# Patient Record
Sex: Female | Born: 1935
Health system: Southern US, Community
[De-identification: ages and names within clinical notes are randomized; demographics above are authoritative.]

## PROBLEM LIST (undated history)

## (undated) DIAGNOSIS — I10 Essential (primary) hypertension: Secondary | ICD-10-CM

## (undated) DIAGNOSIS — E119 Type 2 diabetes mellitus without complications: Secondary | ICD-10-CM

## (undated) DIAGNOSIS — C801 Malignant (primary) neoplasm, unspecified: Secondary | ICD-10-CM

## (undated) HISTORY — PX: BREAST SURGERY: SHX581

---

## 2017-06-11 ENCOUNTER — Emergency Department (HOSPITAL_BASED_OUTPATIENT_CLINIC_OR_DEPARTMENT_OTHER)
Admission: EM | Admit: 2017-06-11 | Discharge: 2017-06-11 | Disposition: A | Discharge status: Home / Self Care | Payer: Medicare Other | Attending: Emergency Medicine | Admitting: Emergency Medicine

## 2017-06-11 ENCOUNTER — Emergency Department (HOSPITAL_BASED_OUTPATIENT_CLINIC_OR_DEPARTMENT_OTHER): Payer: Medicare Other

## 2017-06-11 ENCOUNTER — Encounter (HOSPITAL_BASED_OUTPATIENT_CLINIC_OR_DEPARTMENT_OTHER): Payer: Self-pay

## 2017-06-11 DIAGNOSIS — Z79899 Other long term (current) drug therapy: Secondary | ICD-10-CM | POA: Insufficient documentation

## 2017-06-11 DIAGNOSIS — I509 Heart failure, unspecified: Secondary | ICD-10-CM | POA: Diagnosis not present

## 2017-06-11 DIAGNOSIS — M546 Pain in thoracic spine: Secondary | ICD-10-CM | POA: Diagnosis not present

## 2017-06-11 DIAGNOSIS — I110 Hypertensive heart disease with heart failure: Secondary | ICD-10-CM | POA: Diagnosis not present

## 2017-06-11 DIAGNOSIS — E119 Type 2 diabetes mellitus without complications: Secondary | ICD-10-CM | POA: Insufficient documentation

## 2017-06-11 DIAGNOSIS — M545 Low back pain: Secondary | ICD-10-CM | POA: Diagnosis present

## 2017-06-11 DIAGNOSIS — Z87891 Personal history of nicotine dependence: Secondary | ICD-10-CM | POA: Diagnosis not present

## 2017-06-11 DIAGNOSIS — M549 Dorsalgia, unspecified: Secondary | ICD-10-CM

## 2017-06-11 DIAGNOSIS — Z7984 Long term (current) use of oral hypoglycemic drugs: Secondary | ICD-10-CM | POA: Diagnosis not present

## 2017-06-11 HISTORY — DX: Type 2 diabetes mellitus without complications: E11.9

## 2017-06-11 HISTORY — DX: Malignant (primary) neoplasm, unspecified: C80.1

## 2017-06-11 HISTORY — DX: Essential (primary) hypertension: I10

## 2017-06-11 LAB — COMPREHENSIVE METABOLIC PANEL
ALBUMIN: 4.4 g/dL (ref 3.5–5.0)
ALT: 13 U/L — ABNORMAL LOW (ref 14–54)
ANION GAP: 12 (ref 5–15)
AST: 23 U/L (ref 15–41)
Alkaline Phosphatase: 80 U/L (ref 38–126)
BILIRUBIN TOTAL: 1 mg/dL (ref 0.3–1.2)
BUN: 14 mg/dL (ref 6–20)
CO2: 24 mmol/L (ref 22–32)
Calcium: 10.1 mg/dL (ref 8.9–10.3)
Chloride: 102 mmol/L (ref 101–111)
Creatinine, Ser: 0.6 mg/dL (ref 0.44–1.00)
GFR calc non Af Amer: >60 mL/min (ref >60)
Glucose, Bld: 143 mg/dL — ABNORMAL HIGH (ref 65–99)
POTASSIUM: 3.6 mmol/L (ref 3.5–5.1)
Sodium: 138 mmol/L (ref 135–145)
TOTAL PROTEIN: 7.2 g/dL (ref 6.5–8.1)

## 2017-06-11 LAB — CBC WITH DIFFERENTIAL/PLATELET
BASOS ABS: 0 10*3/uL (ref 0.0–0.1)
Basophils Relative: 0 %
EOS ABS: 0.1 10*3/uL (ref 0.0–0.7)
Eosinophils Relative: 2 %
HCT: 42 % (ref 36.0–46.0)
Hemoglobin: 14.6 g/dL (ref 12.0–15.0)
LYMPHS ABS: 1.5 10*3/uL (ref 0.7–4.0)
LYMPHS PCT: 22 %
MCH: 30.5 pg (ref 26.0–34.0)
MCHC: 34.8 g/dL (ref 30.0–36.0)
MCV: 87.9 fL (ref 78.0–100.0)
MONO ABS: 0.1 10*3/uL (ref 0.1–1.0)
Metamyelocytes Relative: 2 %
Monocytes Relative: 1 %
NEUTROS PCT: 73 %
Neutro Abs: 5.3 10*3/uL (ref 1.7–7.7)
PLATELETS: 205 10*3/uL (ref 150–400)
RBC: 4.78 MIL/uL (ref 3.87–5.11)
RDW: 12.8 % (ref 11.5–15.5)
WBC: 7 10*3/uL (ref 4.0–10.5)

## 2017-06-11 LAB — URINALYSIS, ROUTINE W REFLEX MICROSCOPIC
BILIRUBIN URINE: NEGATIVE
Glucose, UA: NEGATIVE mg/dL
HGB URINE DIPSTICK: NEGATIVE
KETONES UR: NEGATIVE mg/dL
NITRITE: NEGATIVE
PH: 6.5 (ref 5.0–8.0)
Protein, ur: 30 mg/dL — AB
Specific Gravity, Urine: 1.007 (ref 1.005–1.030)

## 2017-06-11 LAB — URINALYSIS, MICROSCOPIC (REFLEX): RBC / HPF: NONE SEEN RBC/hpf (ref 0–5)

## 2017-06-11 LAB — LIPASE, BLOOD: LIPASE: 29 U/L (ref 11–51)

## 2017-06-11 MED ORDER — ONDANSETRON HCL 4 MG/2ML IJ SOLN: 4.0000 mg | Freq: Once | INTRAMUSCULAR | Status: DC

## 2017-06-11 MED ORDER — MELOXICAM 7.5 MG PO TABS: 7.5000 mg | ORAL_TABLET | Freq: Every day | ORAL | 0 refills | Status: AC

## 2017-06-11 MED ORDER — HYDROCODONE-ACETAMINOPHEN 5-325 MG PO TABS
1.0000 | ORAL_TABLET | Freq: Once | ORAL | Status: AC
  Administered 2017-06-11: 1 via ORAL
  Filled 2017-06-11: qty 1

## 2017-06-11 MED ORDER — HYDROCODONE-ACETAMINOPHEN 5-325 MG PO TABS: 1.0000 | ORAL_TABLET | Freq: Every evening | ORAL | 0 refills | Status: AC | PRN

## 2017-06-11 MED ORDER — ONDANSETRON 4 MG PO TBDP
4.0000 mg | ORAL_TABLET | Freq: Once | ORAL | Status: AC
  Administered 2017-06-11: 4 mg via ORAL
  Filled 2017-06-11: qty 1

## 2017-06-11 MED FILL — MELOXICAM 7.5 MG TABLET: 7.5 | 7 days supply | Qty: 7 | Fill #0

## 2017-06-11 MED FILL — HYDROCODON-APAP 5-325: 5-325 | 10 days supply | Qty: 10 | Fill #0

## 2017-06-11 NOTE — ED Notes (Signed)
Patient transported to X-ray 

## 2017-06-11 NOTE — ED Provider Notes (Signed)
Patient seen and evaluated. Discussed with [place] Russo. Normal parasitic back. Pain not reproducible to palpation or breathing. No vesicles. Normal x-ray, lab, urine, and exam. Does have degenerative change of her spine. Symptoms started a few days after a chiropractic manipulation. However, not specifically of this area. Plan is low-dose short course 7.5 mg daily. Vicodin daily at bedtime. Cautioned of the sedative effects. Primary care follow-up.   Tanna Furry, MD 06/11/17 1141

## 2017-06-11 NOTE — ED Triage Notes (Signed)
Pt has complaints of thoracic back pain, went to PCP on Monday and Tuesday with no resolve, negative x-ray and tramadol given. Reports falling about 3 weeks ago which may not be related.

## 2017-06-11 NOTE — ED Notes (Signed)
Pt has complaints of nausea, PA notified.

## 2017-06-11 NOTE — Discharge Instructions (Signed)
Vicodin as needed at night for pain.  Mobic, an anti-inflammatory, once per day with food to avoid stomach upset. Follow-up with your physician.

## 2017-06-11 NOTE — ED Provider Notes (Signed)
[facility] DEPT [facility] Provider Note   CSN: [id number] Arrival date & time: 06/11/17  4235     History   Chief Complaint Chief Complaint  Patient presents with  . Back Pain    Right side     HPI Melissa Horn is a 83 y.o. female w PMHx of HTN, HLD, T2DM, CHF, presenting with persistent right-sided aching thoracic back pain that began gradually Sunday evening and has been worsening since. Pt was seen by UC on Monday where they did a CXR which showed a new lung nodule in the right apex. She saw her PCP on Tuesday, who did blood work and U/A, with normal workup. Pt states she was prescribed tramadol, without significant relief of symptoms. She states she tried to see her PCP this morning, however they were unable to get her in until 1345 and she says she couldn't wait that long 2/t pain. She localizes pain to right posterior lower ribs. States it is not made worse with movement, however she is unable to get comfortable. Reports assoc intermittent post-prandial nausea, without vomiting. Denies abd pain, CP, SOB, F, HA, vision changes, urinary sx, bowel/bladder incontinence, N/T in legs, or any other symptoms today. Hx of cholecystectomy. Patient hypertensive in triage, patient reports she did not take her blood pressure medication this morning.  The history is provided by the patient.    Past Medical History:  Diagnosis Date  . Cancer ([place])   . Diabetes mellitus without complication ([place])   . Hypertension     There are no active problems to display for this patient.   Past Surgical History:  Procedure Laterality Date  . BREAST SURGERY      OB History    No data available       Home Medications    Prior to Admission medications   Medication Sig Start Date End Date Taking? Authorizing Provider  atorvastatin (LIPITOR) 20 MG tablet Take 20 mg by mouth daily.   Yes [provider]  cloNIDine (CATAPRES) 0.3 MG tablet Take 0.3 mg by mouth daily at 2 PM.   Yes [provider]  diltiazem (CARDIZEM CD) 240 MG 24 hr capsule Take 240 mg by mouth daily.   Yes [provider]  furosemide (LASIX) 40 MG tablet Take 40 mg by mouth daily.   Yes [provider]  gabapentin (NEURONTIN) 300 MG capsule Take 300 mg by mouth 4 (four) times daily.   Yes [provider]  glipiZIDE (GLUCOTROL XL) 5 MG 24 hr tablet Take 5 mg by mouth daily with breakfast.   Yes [provider]  losartan (COZAAR) 100 MG tablet Take 100 mg by mouth daily.   Yes [provider]  metFORMIN (GLUCOPHAGE) 1000 MG tablet Take 1,000 mg by mouth 2 (two) times daily with a meal.   Yes [provider]  metoprolol tartrate (LOPRESSOR) 100 MG tablet Take 100 mg by mouth 2 (two) times daily.   Yes [provider]  potassium chloride (K-DUR,KLOR-CON) 10 MEQ tablet Take 10 mEq by mouth daily.   Yes [provider]  sitaGLIPtin (JANUVIA) 50 MG tablet Take 50 mg by mouth daily.   Yes [provider]  trimethoprim (TRIMPEX) 100 MG tablet Take 100 mg by mouth daily.   Yes [provider]  HYDROcodone-acetaminophen (NORCO/VICODIN) 5-325 MG tablet Take 1 tablet by mouth at bedtime as needed for moderate pain. 06/11/17   Tanna Furry, MD  meloxicam (MOBIC) 7.5 MG tablet Take 1 tablet (7.5  mg total) by mouth daily. 06/11/17   Tanna Furry, MD    Family History No family history on file.  Social History Social History  Substance Use Topics  . Smoking status: Former Smoker    Quit date: 06/12/1987  . Smokeless tobacco: Never Used  . Alcohol use No     Allergies   Aspirin   Review of Systems Review of Systems  Constitutional: Negative for chills and fever.  HENT: Negative.   Eyes: Negative for visual disturbance.  Respiratory: Negative for cough and shortness of breath.   Cardiovascular: Negative for chest pain.  Gastrointestinal: Positive for nausea. Negative for abdominal distention, abdominal pain, constipation,  diarrhea and vomiting.       No bowel incontinence  Genitourinary: Negative for difficulty urinating, dysuria, frequency, hematuria, vaginal bleeding and vaginal discharge.  Musculoskeletal: Positive for back pain. Negative for neck pain.  Skin: Negative for rash.  Neurological: Negative for weakness, numbness and headaches.     Physical Exam Updated Vital Signs BP (!) 161/98 (BP Location: Left Arm)   Pulse 87   Temp 98.2 F (36.8 C) (Oral)   Resp 18   Ht 5\' 4"  (1.626 m)   Wt 76.2 kg (168 lb)   SpO2 98%   BMI 28.84 kg/m   Physical Exam  Constitutional: She appears well-developed and well-nourished. No distress.  HENT:  Head: Normocephalic and atraumatic.  Mouth/Throat: Oropharynx is clear and moist.  Eyes: Conjunctivae are normal.  Neck: Normal range of motion.  Cardiovascular: Normal rate, regular rhythm and intact distal pulses.   Murmur heard.  Systolic (holosystolic, best at LSB (chronic)) murmur is present  Pulmonary/Chest: Effort normal and breath sounds normal. No respiratory distress. She has no wheezes. She has no rales.  Abdominal: Soft. Bowel sounds are normal. She exhibits no distension and no mass. There is no tenderness. There is no rebound and no guarding. No hernia.  Musculoskeletal:  Kyphotic spine. No spinal or paraspinal tenderness. No bony step-offs. Minimal  Tenderness over right distal/lateral ribs, no creptis or ecchymosis noted. No overlying rash.   Neurological: She is alert. She displays normal reflexes. She exhibits normal muscle tone.  5/5 strength bilateral upper and lower extremities. Normal gait. Normal sensation.  Skin: Skin is warm.  Psychiatric: She has a normal mood and affect. Her behavior is normal.  Nursing note and vitals reviewed.    ED Treatments / Results  Labs (all labs ordered are listed, but only abnormal results are displayed) Labs Reviewed  URINALYSIS, ROUTINE W REFLEX MICROSCOPIC - Abnormal; Notable for the following:        Result Value   Protein, ur 30 (*)    Leukocytes, UA SMALL (*)    All other components within normal limits  COMPREHENSIVE METABOLIC PANEL - Abnormal; Notable for the following:    Glucose, Bld 143 (*)    ALT 13 (*)    All other components within normal limits  URINALYSIS, MICROSCOPIC (REFLEX) - Abnormal; Notable for the following:    Bacteria, UA RARE (*)    Squamous Epithelial / LPF 0-5 (*)    All other components within normal limits  LIPASE, BLOOD  CBC WITH DIFFERENTIAL/PLATELET    EKG  EKG Interpretation None       Radiology Dg Chest 2 View  Result Date: 06/11/2017 CLINICAL DATA:  Thoracic spine pain for 5 days. History of fall 3 days ago. EXAM: CHEST  2 VIEW COMPARISON:  PET CT scan 01/15/2017. CT chest 01/31/2015. PA and lateral  chest 06/07/2017. FINDINGS: Eventration of the right hemidiaphragm is unchanged. Nodular opacity in the right lung apex is identified and seen on the prior examinations. The lungs are otherwise clear. No pneumothorax or pleural effusion. The patient is status post right mastectomy and axillary dissection. No acute bony abnormality. Thoracic spondylosis noted. IMPRESSION: No acute abnormality. No change in a nodular opacity in the right upper lobe as seen on the prior exams. Electronically Signed   By: Inge Rise M.D.   On: 06/11/2017 10:29   Dg Thoracic Spine W/swimmers  Result Date: 06/11/2017 CLINICAL DATA:  Back pain EXAM: THORACIC SPINE - 3 VIEWS COMPARISON:  06/11/2017 FINDINGS: Normal alignment. No fracture. Degenerative spurring throughout the thoracic spine. IMPRESSION: No acute bony abnormality.  Spondylosis. Electronically Signed   By: Rolm Baptise M.D.   On: 06/11/2017 10:24   Dg Lumbar Spine Complete  Result Date: 06/11/2017 CLINICAL DATA:  Back pain.  Fall 3 weeks ago. EXAM: LUMBAR SPINE - COMPLETE 4+ VIEW COMPARISON:  None. FINDINGS: Degenerative disc and facet disease throughout the lumbar spine. Normal alignment. No fracture.  SI joints are symmetric and unremarkable. Prior cholecystectomy.  Aortic calcifications noted. IMPRESSION: Degenerative disc and facet disease.  No acute bony abnormality. Aortic atherosclerosis. Electronically Signed   By: Rolm Baptise M.D.   On: 06/11/2017 10:24    Procedures Procedures (including critical care time)  Medications Ordered in ED Medications  HYDROcodone-acetaminophen (NORCO/VICODIN) 5-325 MG per tablet 1 tablet (1 tablet Oral Given 06/11/17 1032)  ondansetron (ZOFRAN-ODT) disintegrating tablet 4 mg (4 mg Oral Given 06/11/17 1111)     Initial Impression / Assessment and Plan / ED Course  I have reviewed the triage vital signs and the nursing notes.  Pertinent labs & imaging results that were available during my care of the patient were reviewed by me and considered in my medical decision making (see chart for details).     Pt presenting with persistent right sided back/flank pain. On eval, pt with kyphosis, thought remainder of exam is benign; no spinal tenderness, no abd tenderness, lungs CTAB. Considered nephrolithiasis vs musculoskeletal injury vs pulmonary pathology. Signs and sx not concerning for PE, AAA, spinal injury. Pt without urinary sx or pulmonary sx. No red flags regarding back pain.  U/a unremarkable. CBC, CMP, lipase all wnl. No neurologic deficits. CXR, T-spine, and L-spine imaging without acute pathology. Pt's symptoms managed in ED. Patient discussed with and seen by Dr. Jeneen Rinks, who guided treatment and discharged patient with symptomatic treatment. Return precautions discussed.   Discussed results, findings, treatment and follow up. Patient advised of return precautions. Patient verbalized understanding and agreed with plan.  Final Clinical Impressions(s) / ED Diagnoses   Final diagnoses:  Acute right-sided thoracic back pain    New Prescriptions New Prescriptions   HYDROCODONE-ACETAMINOPHEN (NORCO/VICODIN) 5-325 MG TABLET    Take 1 tablet by mouth at  bedtime as needed for moderate pain.   MELOXICAM (MOBIC) 7.5 MG TABLET    Take 1 tablet (7.5 mg total) by mouth daily.     Russo, [place] N, PA-C 06/11/17 1242    Tanna Furry, MD 06/19/17 2325

## 2018-05-14 IMAGING — DX DG THORACIC SPINE 3V
3 series · 3 of 3 positions shown · non-contrast
Comparison: 06/11/2017

CLINICAL DATA: Back pain

EXAM:
THORACIC SPINE - 3 VIEWS

[t-spine ap]
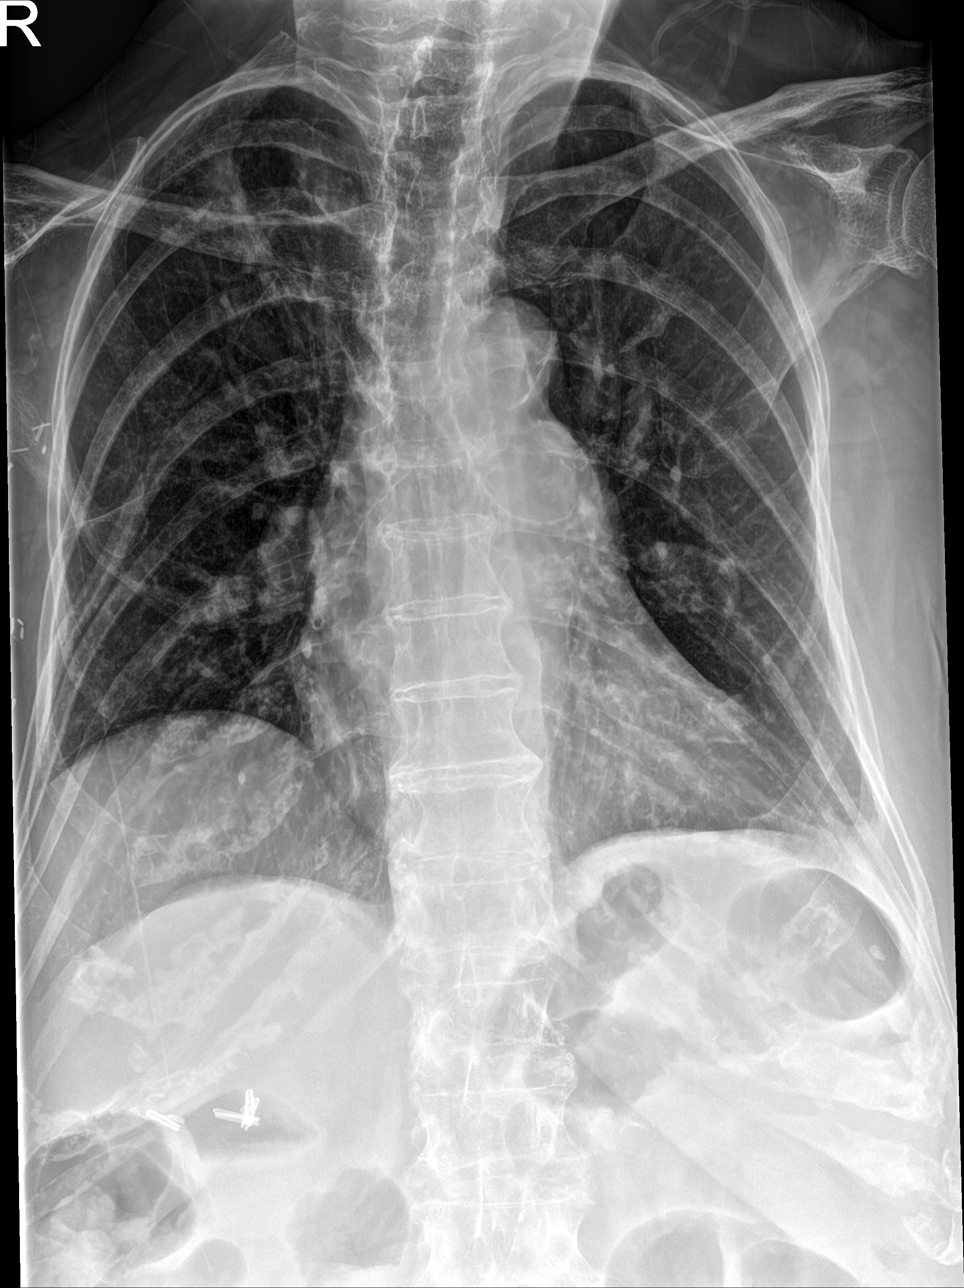

[t-spine swimmers]
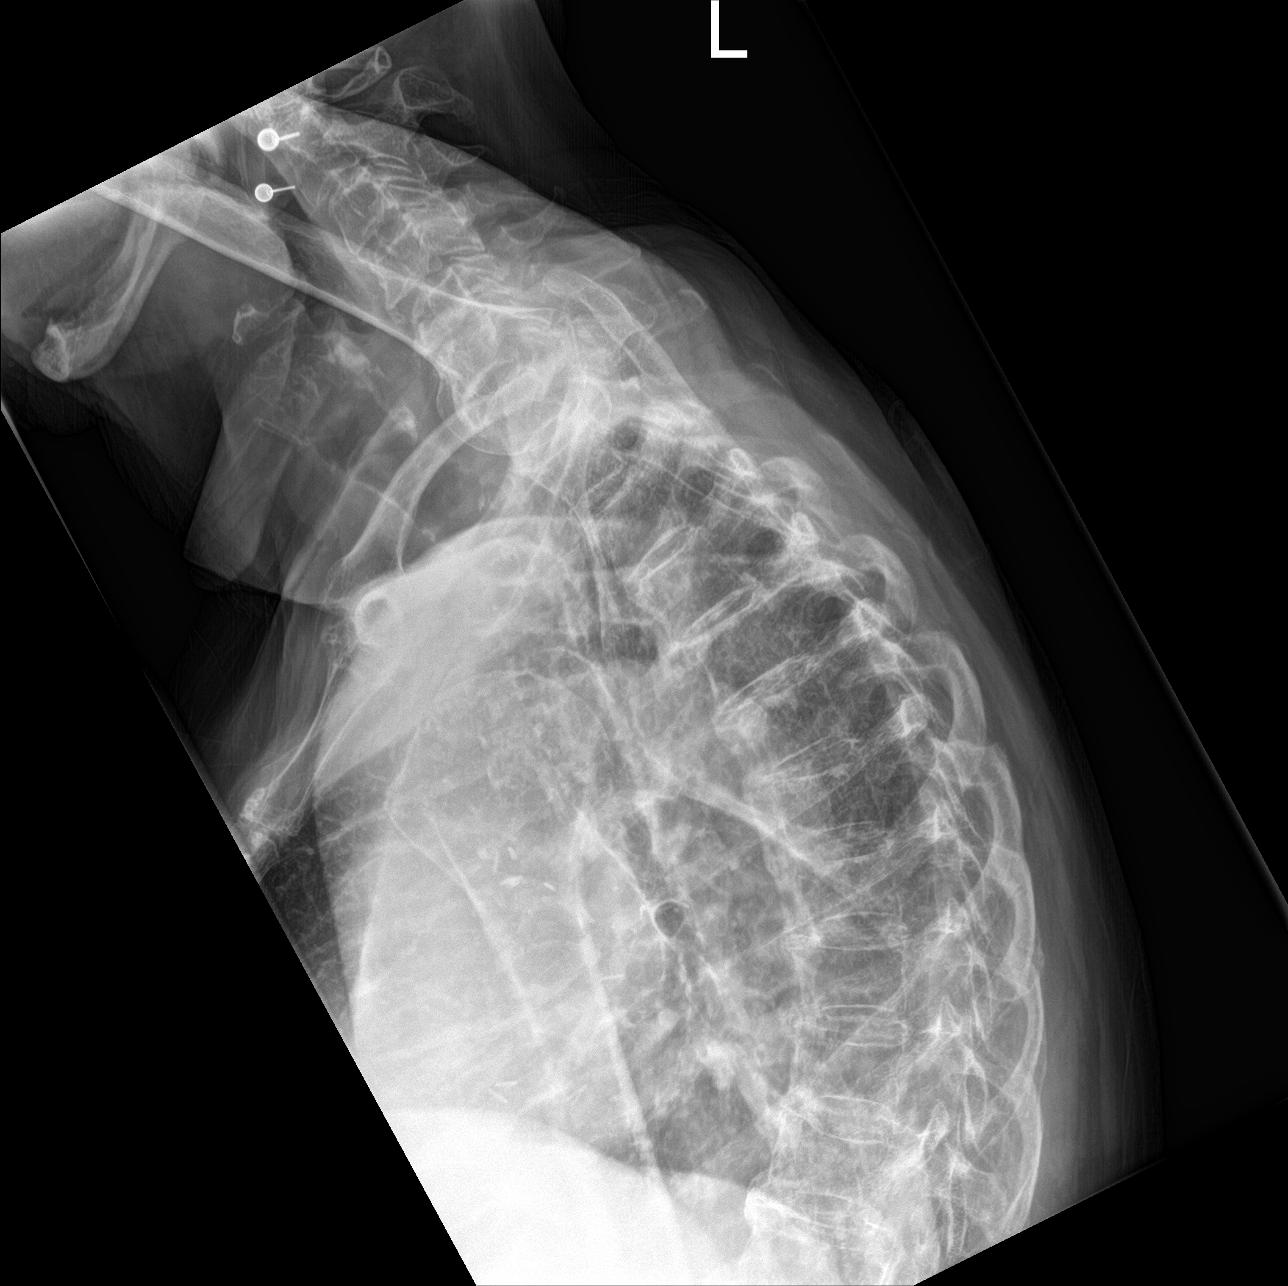

[t-spine lat]
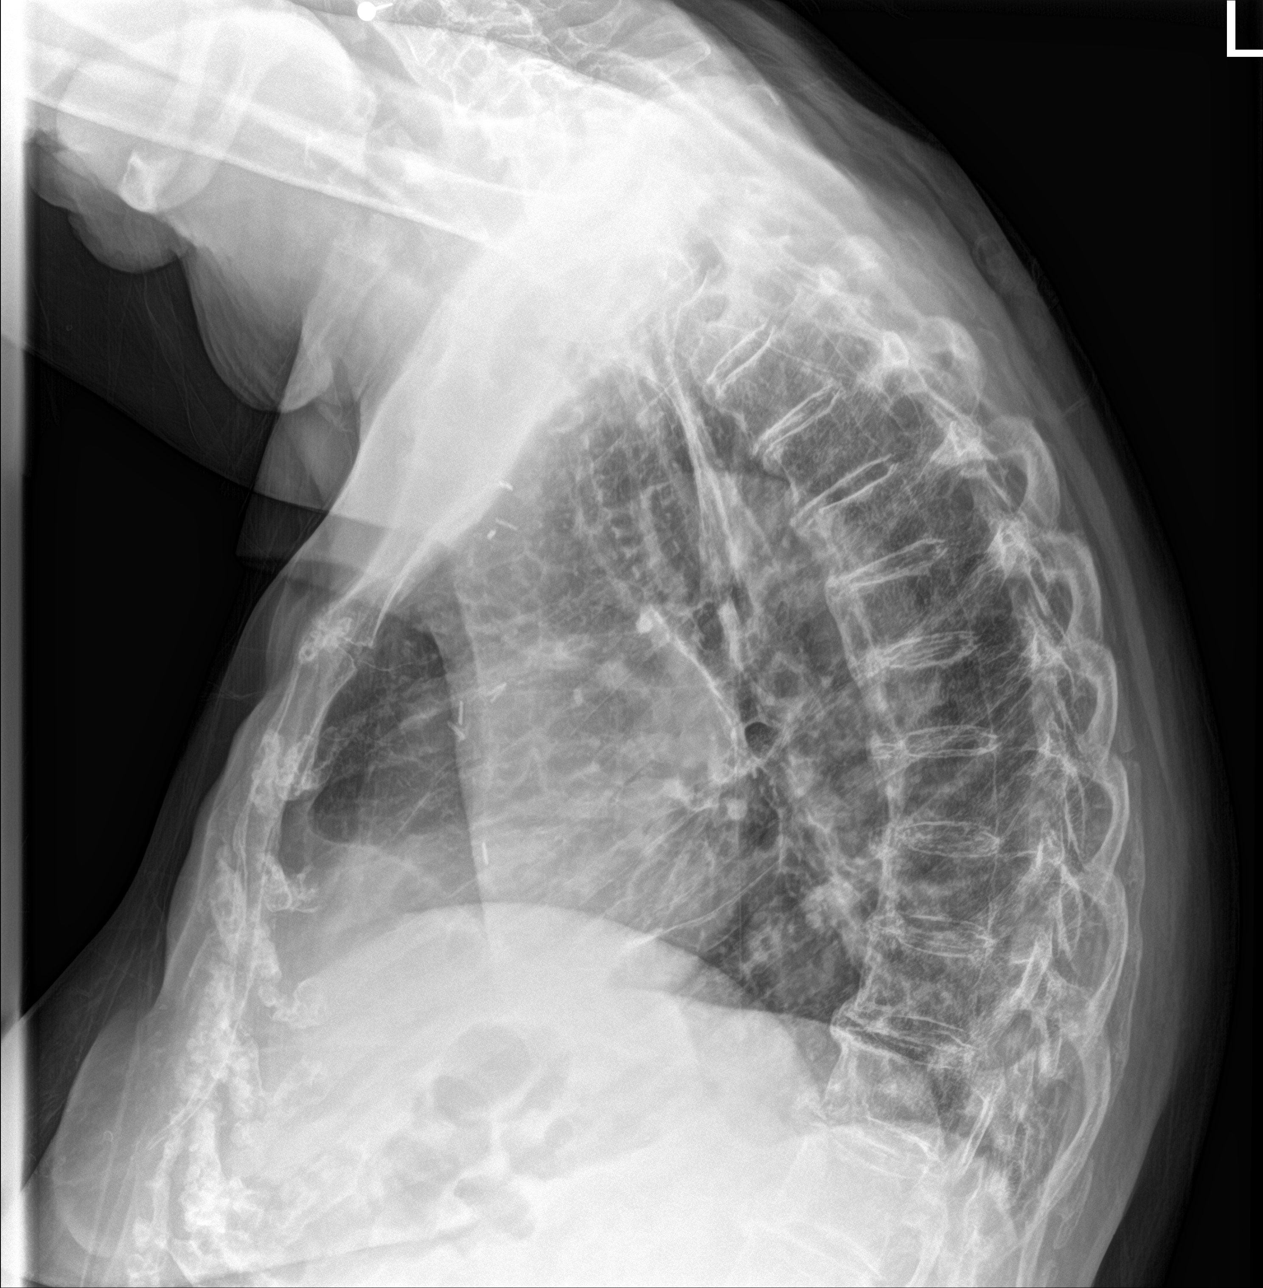

[3 of 3 positions shown; findings below may reference images not displayed]

FINDINGS: Normal alignment. No fracture. Degenerative spurring throughout the
thoracic spine.
IMPRESSION: No acute bony abnormality.  Spondylosis.

## 2020-11-26 DEATH — deceased
